# Patient Record
Sex: Male | Born: 1965 | Race: Black or African American | Hispanic: No | Smoking: Never smoker
Health system: Southern US, Community
[De-identification: ages and names within clinical notes are randomized; demographics above are authoritative.]

## PROBLEM LIST (undated history)

## (undated) DIAGNOSIS — T7840XA Allergy, unspecified, initial encounter: Secondary | ICD-10-CM

## (undated) DIAGNOSIS — K635 Polyp of colon: Secondary | ICD-10-CM

## (undated) DIAGNOSIS — N400 Enlarged prostate without lower urinary tract symptoms: Secondary | ICD-10-CM

## (undated) DIAGNOSIS — E785 Hyperlipidemia, unspecified: Secondary | ICD-10-CM

## (undated) DIAGNOSIS — B019 Varicella without complication: Secondary | ICD-10-CM

## (undated) HISTORY — DX: Hyperlipidemia, unspecified: E78.5

## (undated) HISTORY — DX: Benign prostatic hyperplasia without lower urinary tract symptoms: N40.0

## (undated) HISTORY — DX: Allergy, unspecified, initial encounter: T78.40XA

## (undated) HISTORY — DX: Varicella without complication: B01.9

## (undated) HISTORY — DX: Polyp of colon: K63.5

## (undated) HISTORY — PX: KNEE ARTHROSCOPY W/ ACL RECONSTRUCTION: SHX1858

---

## 2010-04-26 ENCOUNTER — Emergency Department (HOSPITAL_COMMUNITY)
Admission: EM | Admit: 2010-04-26 | Discharge: 2010-04-26 | Payer: Self-pay | Source: Home / Self Care | Admitting: Emergency Medicine

## 2010-07-07 LAB — CBC
MCH: 30.5 pg (ref 26.0–34.0)
MCHC: 33.9 g/dL (ref 30.0–36.0)
RDW: 13.2 % (ref 11.5–15.5)

## 2010-07-07 LAB — BASIC METABOLIC PANEL
BUN: 16 mg/dL (ref 6–23)
CO2: 25 mEq/L (ref 19–32)
Calcium: 9.6 mg/dL (ref 8.4–10.5)
GFR calc non Af Amer: >60 mL/min (ref >60)
Glucose, Bld: 99 mg/dL (ref 70–99)

## 2010-07-07 LAB — DIFFERENTIAL
Basophils Absolute: 0 10*3/uL (ref 0.0–0.1)
Basophils Relative: 0 % (ref 0–1)
Eosinophils Absolute: 0 10*3/uL (ref 0.0–0.7)
Monocytes Absolute: 0.4 10*3/uL (ref 0.1–1.0)
Monocytes Relative: 6 % (ref 3–12)
Neutro Abs: 4.5 10*3/uL (ref 1.7–7.7)
Neutrophils Relative %: 65 % (ref 43–77)

## 2010-07-07 LAB — POCT CARDIAC MARKERS
Myoglobin, poc: 165 ng/mL (ref 12–200)
Myoglobin, poc: 184 ng/mL (ref 12–200)
Troponin i, poc: <0.05 ng/mL (ref 0.00–0.09)

## 2010-07-07 LAB — URINALYSIS, ROUTINE W REFLEX MICROSCOPIC
Bilirubin Urine: NEGATIVE
Hgb urine dipstick: NEGATIVE
Nitrite: NEGATIVE
Protein, ur: NEGATIVE mg/dL
Urobilinogen, UA: 1 mg/dL (ref 0.0–1.0)

## 2011-07-08 IMAGING — CR DG CHEST 2V
2 series · 2 of 2 positions shown · non-contrast
Comparison: None.

CLINICAL DATA: Anxiety, arrhythmia

CHEST - 2 VIEW

[w chest pa]
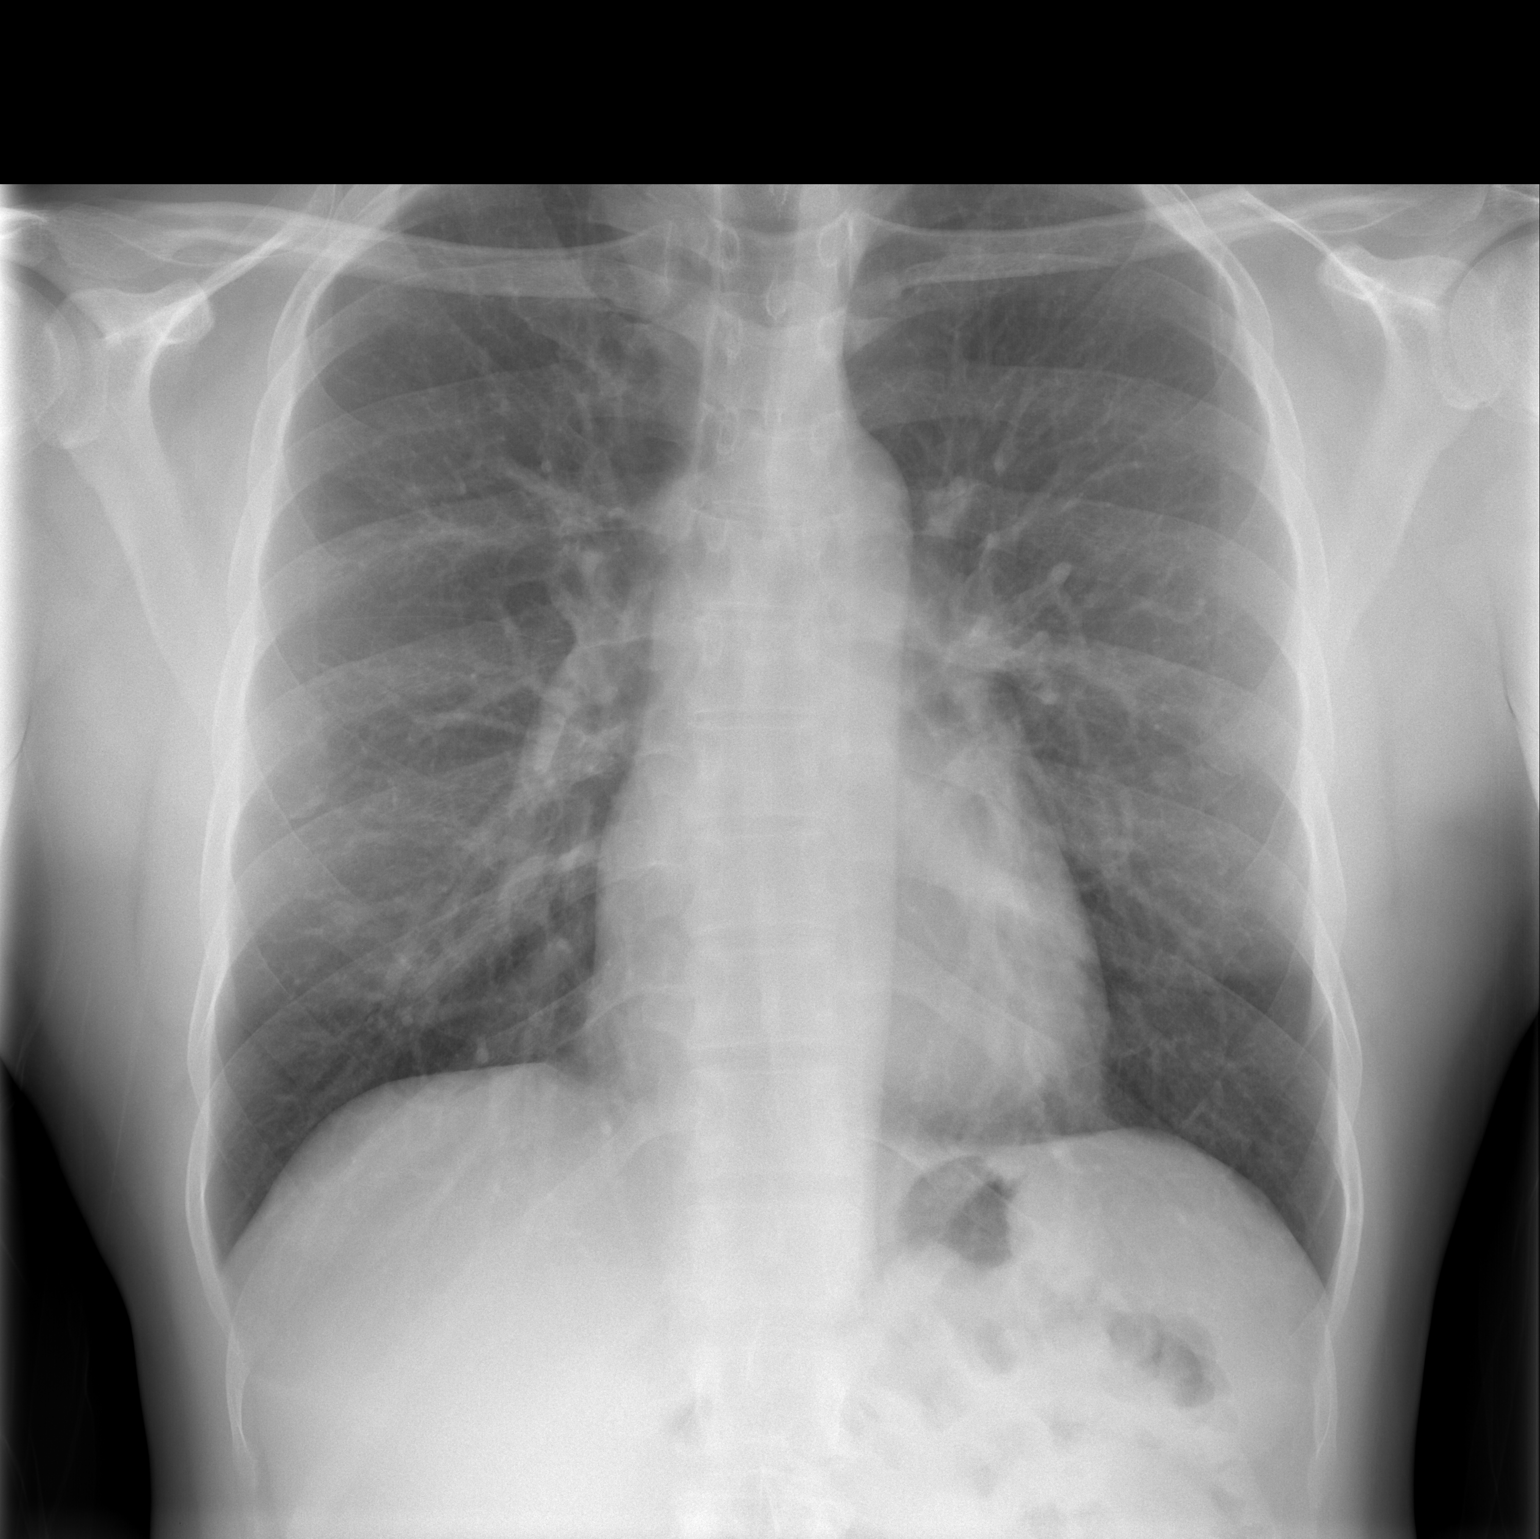

[w chest lat]
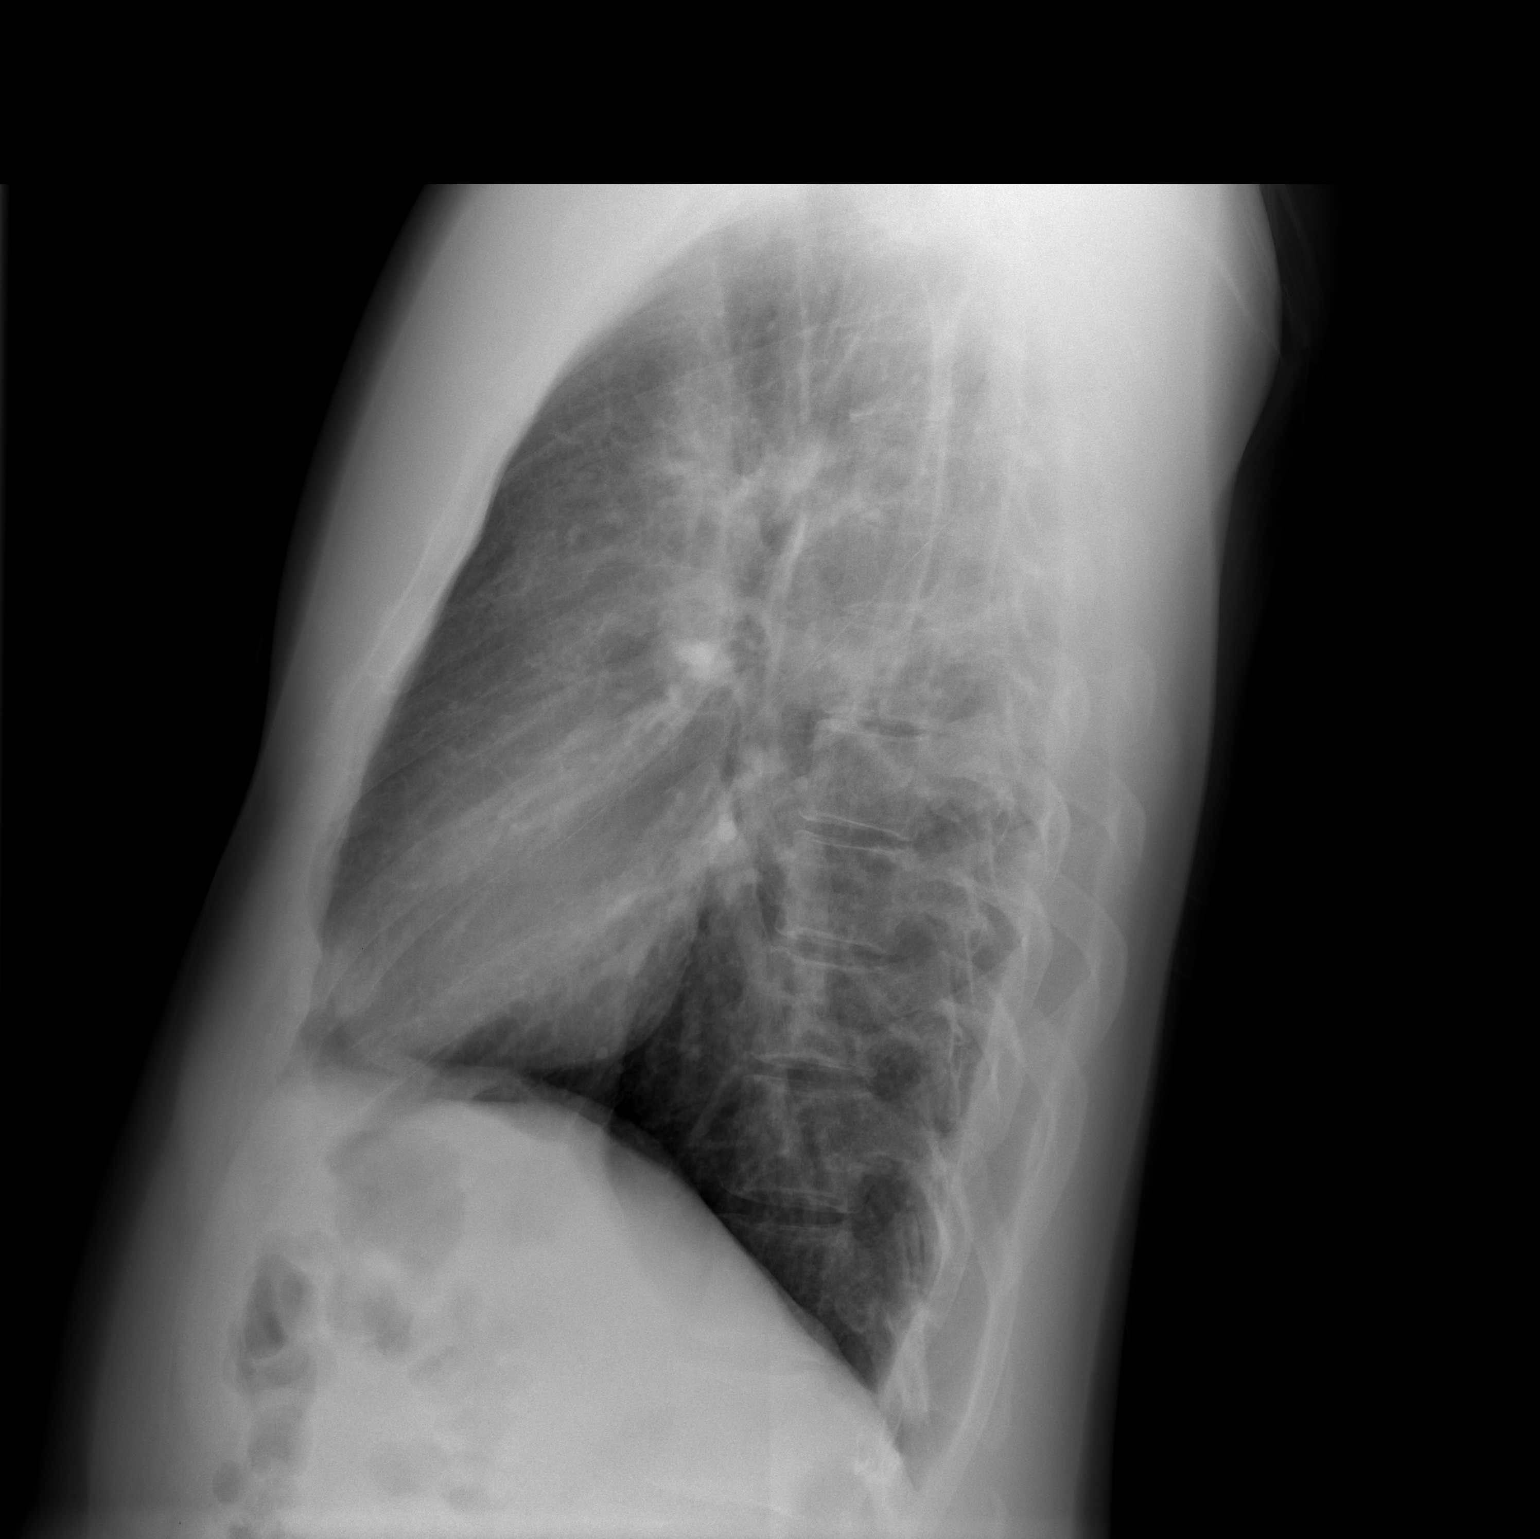

[2 of 2 positions shown; findings below may reference images not displayed]

FINDINGS: Normal mediastinum and heart silhouette.  Costophrenic
angles are clear.  No evidence effusion, infiltrate, or
pneumothorax.  There is chronic appearing bronchitic markings.
IMPRESSION: No acute cardiopulmonary process.

## 2014-06-06 DIAGNOSIS — N529 Male erectile dysfunction, unspecified: Secondary | ICD-10-CM | POA: Insufficient documentation

## 2014-06-06 HISTORY — DX: Male erectile dysfunction, unspecified: N52.9

## 2015-07-09 DIAGNOSIS — J3089 Other allergic rhinitis: Secondary | ICD-10-CM | POA: Insufficient documentation

## 2015-07-09 HISTORY — DX: Other allergic rhinitis: J30.89

## 2019-07-06 ENCOUNTER — Ambulatory Visit: Payer: Self-pay | Attending: Internal Medicine

## 2019-07-06 DIAGNOSIS — Z23 Encounter for immunization: Secondary | ICD-10-CM

## 2019-07-06 NOTE — Progress Notes (Signed)
   Covid-19 Vaccination Clinic  Name:  Deja Kaigler    MRN: [id number] DOB: 14-Jan-1966  07/06/2019  Mr. Wakeland was observed post Covid-19 immunization for 15 minutes without incident. He was provided with Vaccine Information Sheet and instruction to access the V-Safe system.   Mr. Dentler was instructed to call 911 with any severe reactions post vaccine: Marland Kitchen Difficulty breathing  . Swelling of face and throat  . A fast heartbeat  . A bad rash all over body  . Dizziness and weakness   Immunizations Administered    Name Date Dose VIS Date Route   Pfizer COVID-19 Vaccine 07/06/2019  8:57 AM 0.3 mL 04/07/2019 Intramuscular   Manufacturer: [employer], [employer]   Lot: [id number]   NDC: [address]-2

## 2019-07-31 ENCOUNTER — Ambulatory Visit: Payer: Self-pay | Attending: Internal Medicine

## 2019-07-31 DIAGNOSIS — Z23 Encounter for immunization: Secondary | ICD-10-CM

## 2019-07-31 NOTE — Progress Notes (Signed)
   Covid-19 Vaccination Clinic  Name:  Lucas Doyle    MRN: [id number] DOB: October 31, 1965  07/31/2019  [name] was observed post Covid-19 immunization for 15 minutes without incident. He was provided with Vaccine Information Sheet and instruction to access the V-Safe system.   [name] was instructed to call 911 with any severe reactions post vaccine: Marland Kitchen Difficulty breathing  . Swelling of face and throat  . A fast heartbeat  . A bad rash all over body  . Dizziness and weakness   Immunizations Administered    Name Date Dose VIS Date Route   Pfizer COVID-19 Vaccine 07/31/2019  8:56 AM 0.3 mL 04/07/2019 Intramuscular   Manufacturer: [employer], [employer]   Lot: [id number]   NDC: [address]-8

## 2019-10-24 ENCOUNTER — Other Ambulatory Visit: Payer: Self-pay

## 2019-10-24 ENCOUNTER — Ambulatory Visit (INDEPENDENT_AMBULATORY_CARE_PROVIDER_SITE_OTHER): Payer: 59 | Admitting: Family Medicine

## 2019-10-24 ENCOUNTER — Encounter: Payer: Self-pay | Admitting: Family Medicine

## 2019-10-24 VITALS — BP 138/79 | HR 88 | Temp 98.4°F | Resp 16 | Ht 70.75 in | Wt 188.4 lb

## 2019-10-24 DIAGNOSIS — Z131 Encounter for screening for diabetes mellitus: Secondary | ICD-10-CM | POA: Diagnosis not present

## 2019-10-24 DIAGNOSIS — Z114 Encounter for screening for human immunodeficiency virus [HIV]: Secondary | ICD-10-CM

## 2019-10-24 DIAGNOSIS — Z7689 Persons encountering health services in other specified circumstances: Secondary | ICD-10-CM

## 2019-10-24 DIAGNOSIS — Z125 Encounter for screening for malignant neoplasm of prostate: Secondary | ICD-10-CM | POA: Diagnosis not present

## 2019-10-24 DIAGNOSIS — Z130 Encounter for screening for diseases of the blood and blood-forming organs and certain disorders involving the immune mechanism: Secondary | ICD-10-CM

## 2019-10-24 DIAGNOSIS — N529 Male erectile dysfunction, unspecified: Secondary | ICD-10-CM | POA: Diagnosis not present

## 2019-10-24 DIAGNOSIS — E782 Mixed hyperlipidemia: Secondary | ICD-10-CM | POA: Diagnosis not present

## 2019-10-24 DIAGNOSIS — Z23 Encounter for immunization: Secondary | ICD-10-CM

## 2019-10-24 DIAGNOSIS — E663 Overweight: Secondary | ICD-10-CM | POA: Insufficient documentation

## 2019-10-24 DIAGNOSIS — Z0000 Encounter for general adult medical examination without abnormal findings: Secondary | ICD-10-CM | POA: Diagnosis not present

## 2019-10-24 DIAGNOSIS — R030 Elevated blood-pressure reading, without diagnosis of hypertension: Secondary | ICD-10-CM

## 2019-10-24 LAB — COMPREHENSIVE METABOLIC PANEL
ALT: 27 U/L (ref 0–53)
AST: 24 U/L (ref 0–37)
Albumin: 4.5 g/dL (ref 3.5–5.2)
Alkaline Phosphatase: 54 U/L (ref 39–117)
BUN: 11 mg/dL (ref 6–23)
CO2: 26 mEq/L (ref 19–32)
Calcium: 9 mg/dL (ref 8.4–10.5)
Chloride: 105 mEq/L (ref 96–112)
Creatinine, Ser: 0.85 mg/dL (ref 0.40–1.50)
GFR: 113.11 mL/min (ref >60.00)
Glucose, Bld: 103 mg/dL — ABNORMAL HIGH (ref 70–99)
Potassium: 3.7 mEq/L (ref 3.5–5.1)
Sodium: 139 mEq/L (ref 135–145)
Total Bilirubin: 0.6 mg/dL (ref 0.2–1.2)
Total Protein: 7.3 g/dL (ref 6.0–8.3)

## 2019-10-24 LAB — LIPID PANEL
Cholesterol: 225 mg/dL — ABNORMAL HIGH (ref 0–200)
HDL: 60.6 mg/dL (ref >39.00)
LDL Cholesterol: 145 mg/dL — ABNORMAL HIGH (ref 0–99)
NonHDL: 164.62
Total CHOL/HDL Ratio: 4
Triglycerides: 99 mg/dL (ref 0.0–149.0)
VLDL: 19.8 mg/dL (ref 0.0–40.0)

## 2019-10-24 LAB — CBC
HCT: 42.9 % (ref 39.0–52.0)
Hemoglobin: 14.3 g/dL (ref 13.0–17.0)
MCHC: 33.4 g/dL (ref 30.0–36.0)
MCV: 89.6 fl (ref 78.0–100.0)
Platelets: 189 10*3/uL (ref 150.0–400.0)
RBC: 4.78 Mil/uL (ref 4.22–5.81)
RDW: 13.6 % (ref 11.5–15.5)
WBC: 4 10*3/uL (ref 4.0–10.5)

## 2019-10-24 LAB — PSA: PSA: 0.81 ng/mL (ref 0.10–4.00)

## 2019-10-24 LAB — TSH: TSH: 0.81 u[IU]/mL (ref 0.35–4.50)

## 2019-10-24 LAB — HEMOGLOBIN A1C: Hgb A1c MFr Bld: 5.4 % (ref 4.6–6.5)

## 2019-10-24 MED ORDER — TADALAFIL 20 MG PO TABS: 20.0000 mg | ORAL_TABLET | Freq: Every day | ORAL | 5 refills | Status: DC | PRN

## 2019-10-24 NOTE — Patient Instructions (Addendum)
Shingrix #1 provided today.  Nurse visit in 3 months for shingrix #2.   Health Maintenance, Male Adopting a healthy lifestyle and getting preventive care are important in promoting health and wellness. Ask your health care provider about:  The right schedule for you to have regular tests and exams.  Things you can do on your own to prevent diseases and keep yourself healthy. What should I know about diet, weight, and exercise? Eat a healthy diet   Eat a diet that includes plenty of vegetables, fruits, low-fat dairy products, and lean protein.  Do not eat a lot of foods that are high in solid fats, added sugars, or sodium. Maintain a healthy weight Body mass index (BMI) is a measurement that can be used to identify possible weight problems. It estimates body fat based on height and weight. Your health care provider can help determine your BMI and help you achieve or maintain a healthy weight. Get regular exercise Get regular exercise. This is one of the most important things you can do for your health. Most adults should:  Exercise for at least 150 minutes each week. The exercise should increase your heart rate and make you sweat (moderate-intensity exercise).  Do strengthening exercises at least twice a week. This is in addition to the moderate-intensity exercise.  Spend less time sitting. Even light physical activity can be beneficial. Watch cholesterol and blood lipids Have your blood tested for lipids and cholesterol at 55 years of age, then have this test every 5 years. You may need to have your cholesterol levels checked more often if:  Your lipid or cholesterol levels are high.  You are older than 55 years of age.  You are at high risk for heart disease. What should I know about cancer screening? Many types of cancers can be detected early and may often be prevented. Depending on your health history and family history, you may need to have cancer screening at various ages.  This may include screening for:  Colorectal cancer.  Prostate cancer.  Skin cancer.  Lung cancer. What should I know about heart disease, diabetes, and high blood pressure? Blood pressure and heart disease  High blood pressure causes heart disease and increases the risk of stroke. This is more likely to develop in people who have high blood pressure readings, are of African descent, or are overweight.  Talk with your health care provider about your target blood pressure readings.  Have your blood pressure checked: ? Every 3-5 years if you are 53-74 years of age. ? Every year if you are 19 years old or older.  If you are between the ages of 5 and 53 and are a current or former smoker, ask your health care provider if you should have a one-time screening for abdominal aortic aneurysm (AAA). Diabetes Have regular diabetes screenings. This checks your fasting blood sugar level. Have the screening done:  Once every three years after age 44 if you are at a normal weight and have a low risk for diabetes.  More often and at a younger age if you are overweight or have a high risk for diabetes. What should I know about preventing infection? Hepatitis B If you have a higher risk for hepatitis B, you should be screened for this virus. Talk with your health care provider to find out if you are at risk for hepatitis B infection. Hepatitis C Blood testing is recommended for:  Everyone born from [birth year] through 1965.  Anyone with known risk  factors for hepatitis C. Sexually transmitted infections (STIs)  You should be screened each year for STIs, including gonorrhea and chlamydia, if: ? You are sexually active and are younger than 55 years of age. ? You are older than 55 years of age and your health care provider tells you that you are at risk for this type of infection. ? Your sexual activity has changed since you were last screened, and you are at increased risk for chlamydia or gonorrhea.  Ask your health care provider if you are at risk.  Ask your health care provider about whether you are at high risk for HIV. Your health care provider may recommend a prescription medicine to help prevent HIV infection. If you choose to take medicine to prevent HIV, you should first get tested for HIV. You should then be tested every 3 months for as long as you are taking the medicine. Follow these instructions at home: Lifestyle  Do not use any products that contain nicotine or tobacco, such as cigarettes, e-cigarettes, and chewing tobacco. If you need help quitting, ask your health care provider.  Do not use street drugs.  Do not share needles.  Ask your health care provider for help if you need support or information about quitting drugs. Alcohol use  Do not drink alcohol if your health care provider tells you not to drink.  If you drink alcohol: ? Limit how much you have to 0-2 drinks a day. ? Be aware of how much alcohol is in your drink. In the [place] one drink equals one 12 oz bottle of beer (355 mL), one 5 oz glass of wine (148 mL), or one 1 oz glass of hard liquor (44 mL). General instructions  Schedule regular health, dental, and eye exams.  Stay current with your vaccines.  Tell your health care provider if: ? You often feel depressed. ? You have ever been abused or do not feel safe at home. Summary  Adopting a healthy lifestyle and getting preventive care are important in promoting health and wellness.  Follow your health care provider's instructions about healthy diet, exercising, and getting tested or screened for diseases.  Follow your health care provider's instructions on monitoring your cholesterol and blood pressure. This information is not intended to replace advice given to you by your health care provider. Make sure you discuss any questions you have with your health care provider. Document Revised: 04/06/2018 Document Reviewed: 04/06/2018 Elsevier Patient  Education  2020 [employer].

## 2019-10-24 NOTE — Progress Notes (Signed)
Patient ID: Lucas Doyle, male  DOB: [birth year], 54 y.o.   MRN: [id number] Patient Care Team    Relationship Specialty Notifications Start End  Natalia Leatherwood, DO PCP - General Family Medicine  10/24/19   Dorna Leitz, MD Referring Physician Internal Medicine  10/25/19     Chief Complaint  Patient presents with   Establish Care    Would like CPE. Dr Landry Dyke.  Colonoscopy in 2018- to repeat in 10 yrs    Annual Exam    Subjective: Lucas Doyle is a 54 y.o.  male present for new patient establishment. All past medical history, surgical history, allergies, family history, immunizations, medications and social history were updated in the electronic medical record today. All recent labs, ED visits and hospitalizations within the last year were reviewed.   Health maintenance:  Colonoscopy: completed 12/02/2015, by Dr. Octaviano Glow follow up 10 years Immunizations: tdap 2012, Influenza UTD (encouraged yearly), shingrix #1 completed today.  Covid series completed. Infectious disease screening: HIV completed today.  PSA:  Lab Results  Component Value Date   PSA 0.81 10/24/2019  -no prior records available.  Patient reports always has been normal.  Family history of prostate cancer in his father.  African-American male-higher risk. Assistive device: None Oxygen use: None Patient has a Dental home. Hospitalizations/ED visits: No prior records available  Patient also would like to discuss his erectile dysfunction today.  He reports he had been on Cialis 20 mg as needed for his erectile dysfunction.  He denies any side effects to medication use.  He would like refills to this medication.  Depression screen [facility] 2/9 10/24/2019  Decreased Interest 0  Down, Depressed, Hopeless 0  PHQ - 2 Score 0   No flowsheet data found.      No flowsheet data found.   Immunization History  Administered Date(s) Administered   Influenza-Unspecified 02/23/2014, 02/23/2014, 02/08/2015,  02/08/2015, 02/16/2016, 02/16/2016   PFIZER SARS-COV-2 Vaccination 07/06/2019, 07/31/2019   Tdap 07/09/2010, 07/09/2010   Zoster Recombinat (Shingrix) 10/24/2019    No exam data present  Past Medical History:  Diagnosis Date   Allergy    Chicken pox    Colon polyps    Hyperlipidemia    Male erectile disorder 06/06/2014   Other allergic rhinitis 07/09/2015   No Known Allergies Past Surgical History:  Procedure Laterality Date   KNEE ARTHROSCOPY W/ ACL RECONSTRUCTION     Family History  Problem Relation Age of Onset   Early death Mother    Diabetes Mother    Heart disease Mother    Heart attack Mother    Stroke Mother    Alcohol abuse Father    Cancer Father    Prostate cancer Father    Hypertension Sister    Seizures Son    Throat cancer Maternal Grandfather    Prostate cancer Paternal Grandfather    Social History   Social History Narrative   Not on file    Allergies as of 10/24/2019   No Known Allergies     Medication List       Accurate as of October 24, 2019 11:59 PM. If you have any questions, ask your nurse or doctor.        OVER THE COUNTER MEDICATION Symplex M  135mg  Once daily   tadalafil 20 MG tablet Commonly known as: Cialis Take 1 tablet (20 mg total) by mouth daily as needed for erectile dysfunction. Started by: , DO  All past medical history, surgical history, allergies, family history, immunizations andmedications were updated in the EMR today and reviewed under the history and medication portions of their EMR.     No results found.   ROS: 14 pt review of systems performed and negative (unless mentioned in an HPI)  Objective: BP 138/79 (BP Location: Left Arm, Patient Position: Sitting, Cuff Size: Normal)    Pulse 88    Temp 98.4 F (36.9 C) (Temporal)    Resp 16    Ht 5' 10.75" (1.797 m)    Wt 188 lb 6 oz (85.4 kg)    SpO2 97%    BMI 26.46 kg/m  Gen: Afebrile. No acute distress. Nontoxic in  appearance, well-developed, well-nourished, physically fit, African-American male, very pleasant HENT: AT. [place]. Bilateral TM visualized and normal in appearance, normal external auditory canal. MMM, no oral lesions, adequate dentition. Bilateral nares within normal limits. Throat without erythema, ulcerations or exudates.  No cough on exam, no hoarseness on exam. Eyes:Pupils Equal Round Reactive to light, Extraocular movements intact,  Conjunctiva without redness, discharge or icterus. Neck/lymp/endocrine: Supple, no lymphadenopathy, no thyromegaly CV: RRR no murmur, no edema, +2/4 P posterior tibialis pulses.  Chest: CTAB, no wheeze, rhonchi or crackles.  Normal respiratory effort.  Good air movement. Abd: Soft.  Flat. NTND. BS present.  No masses palpated. No hepatosplenomegaly. No rebound tenderness or guarding. Skin: No rashes, purpura or petechiae. Warm and well-perfused. Skin intact. Neuro/Msk:  Normal gait. PERLA. EOMi. Alert. Oriented x3.  Cranial nerves II through XII intact. Muscle strength 5/5 upper/lower extremity. DTRs equal bilaterally. Psych: Normal affect, dress and demeanor. Normal speech. Normal thought content and judgment.   Assessment/plan: Lucas Doyle is a 54 y.o. male present for est/cpe Diabetes mellitus screening - Hemoglobin A1c Elevated BP without diagnosis of hypertension Patient to monitor outside of doctor's office setting.  He reports blood pressures always have been well controlled even at recent dental visits. - Comprehensive metabolic panel - TSH -CBC  prostate cancer screening High risk AAM with family history in first-degree relative (father) with prostate cancer Encouraged yearly PSAs - PSA Encounter for screening for HIV - HIV antibody (with reflex) Need for shingles vaccine Nurse visit 3 months for Shingrix No. 2 - Varicella-zoster vaccine IM  Male erectile disorder Patient reports prior use of Cialis 20 mg daily PR and for erectile disorder.   He denies any side effects with that medication and would like refills on that prescription today.  Mixed hyperlipidemia Lipids collected today Patient reports a history of hyperlipidemia that he controls with diet alone.  Strong family history for heart disease and stroke. Continue dietary changes and routine exercise.  Encounter for preventive health examination Patient was encouraged to exercise greater than 150 minutes a week. Patient was encouraged to choose a diet filled with fresh fruits and vegetables, and lean meats. AVS provided to patient today for education/recommendation on gender specific health and safety maintenance. Colonoscopy: Due 11/2025 Immunizations: tdap 2012, Influenza UTD (encouraged yearly), shingrix #1 completed today.  Covid series completed. Infectious disease screening: HIV completed today.  PSA: Collected today  Patient reports always has been normal.  Family history of prostate cancer in his father.  African-American male-higher risk.  Return in about 1 year (around 10/23/2020) for CPE (30 min).  Orders Placed This Encounter  Procedures   Varicella-zoster vaccine IM   CBC   Comprehensive metabolic panel   Hemoglobin A1c   Lipid panel   TSH   PSA  HIV antibody (with reflex)   Meds ordered this encounter  Medications   tadalafil (CIALIS) 20 MG tablet    Sig: Take 1 tablet (20 mg total) by mouth daily as needed for erectile dysfunction.    Dispense:  10 tablet    Refill:  5   Referral Orders  No referral(s) requested today     Note is dictated utilizing voice recognition software. Although note has been proof read prior to signing, occasional typographical errors still can be missed. If any questions arise, please do not hesitate to call for verification.  Electronically signed by: Felix Pacini, DO [facility] [place]

## 2019-10-25 ENCOUNTER — Encounter: Payer: Self-pay | Admitting: Family Medicine

## 2019-10-25 ENCOUNTER — Telehealth: Payer: Self-pay | Admitting: Family Medicine

## 2019-10-25 DIAGNOSIS — E785 Hyperlipidemia, unspecified: Secondary | ICD-10-CM | POA: Insufficient documentation

## 2019-10-25 DIAGNOSIS — E782 Mixed hyperlipidemia: Secondary | ICD-10-CM

## 2019-10-25 LAB — HIV ANTIBODY (ROUTINE TESTING W REFLEX): HIV 1&2 Ab, 4th Generation: NONREACTIVE

## 2019-10-25 NOTE — Telephone Encounter (Signed)
Please inform patient Liver, kidney and thyroid function are normal Blood cell counts and electrolytes are normal Prostate cancer screening levels are normal HIV screening is negative Diabetes screening/A1c is normal at 5.4 Cholesterol panel looks okay. With a total cholesterol of 225, HDL 60, LDL 145 and triglycerides 99.   -Cholesterol panel is okay.  However it would be to his benefit to try to get his LDL less than 130 since he has a significant family history of heart disease and stroke.  Continue the exercise regimen, he does well on this. I would recommend a mediterranean diet if he is not following one already.  A mediterranean diet is high in fruits, vegetables, whole grains, fish, chicken, nuts, healthy fats (olive oil or canola oil). Low fat dairy.  Limit butter, margarine, red meat and sweets.   We will continue to monitor levels yearly and make recommendations for him at his physicals. Follow-up 1 year for CPE, sooner if needed or needing refills on medications prior to physical

## 2019-10-25 NOTE — Telephone Encounter (Signed)
Pt was called and given lab results, he verbalized understanding  

## 2019-10-26 ENCOUNTER — Encounter: Payer: Self-pay | Admitting: Family Medicine

## 2019-10-26 DIAGNOSIS — N528 Other male erectile dysfunction: Secondary | ICD-10-CM

## 2019-10-26 NOTE — Telephone Encounter (Signed)
Pt wrote another message saying there was another option you had given him. Please advise

## 2019-10-26 NOTE — Telephone Encounter (Signed)
Please place urology for referral for erectile dysfunction.

## 2019-10-26 NOTE — Telephone Encounter (Signed)
Options are: -He can try Viagra (or generic Viagra) Or -Place a referral to urology as they sometimes have better options for discounts on these type of medications.  Let me know what he decides. Thanks

## 2019-10-26 NOTE — Telephone Encounter (Signed)
Pt would like urologist referral

## 2019-10-27 NOTE — Addendum Note (Signed)
Addended by: Daryel November L on: 10/27/2019 08:17 AM   Modules accepted: Orders

## 2019-12-25 ENCOUNTER — Ambulatory Visit (INDEPENDENT_AMBULATORY_CARE_PROVIDER_SITE_OTHER): Payer: 59

## 2019-12-25 ENCOUNTER — Other Ambulatory Visit: Payer: Self-pay

## 2019-12-25 DIAGNOSIS — Z23 Encounter for immunization: Secondary | ICD-10-CM | POA: Diagnosis not present

## 2019-12-25 NOTE — Progress Notes (Signed)
Lucas Doyle is a 54 y.o. male presents to the office today for Shingrix injections, per physician's orders. Original order: 10/24/2019 Nurse visit 3 months for Shingrix No. 2 - Varicella-zoster vaccine IM was administered in left deltoid today. Patient tolerated injection. Patient due for follow up labs/provider appt: No. Date due: n/a, appt made No Patient next injection due: n/a, appt made No  Paschal Dopp

## 2020-10-23 ENCOUNTER — Encounter: Payer: 59 | Admitting: Family Medicine

## 2020-11-18 ENCOUNTER — Other Ambulatory Visit: Payer: Self-pay

## 2020-11-18 ENCOUNTER — Encounter: Payer: Self-pay | Admitting: Family Medicine

## 2020-11-18 ENCOUNTER — Ambulatory Visit (INDEPENDENT_AMBULATORY_CARE_PROVIDER_SITE_OTHER): Payer: 59 | Admitting: Family Medicine

## 2020-11-18 VITALS — BP 114/72 | HR 68 | Temp 98.0°F | Ht 71.5 in | Wt 180.0 lb

## 2020-11-18 DIAGNOSIS — Z0000 Encounter for general adult medical examination without abnormal findings: Secondary | ICD-10-CM | POA: Diagnosis not present

## 2020-11-18 DIAGNOSIS — Z1159 Encounter for screening for other viral diseases: Secondary | ICD-10-CM

## 2020-11-18 DIAGNOSIS — Z23 Encounter for immunization: Secondary | ICD-10-CM | POA: Diagnosis not present

## 2020-11-18 DIAGNOSIS — Z131 Encounter for screening for diabetes mellitus: Secondary | ICD-10-CM | POA: Diagnosis not present

## 2020-11-18 DIAGNOSIS — B351 Tinea unguium: Secondary | ICD-10-CM

## 2020-11-18 DIAGNOSIS — Z125 Encounter for screening for malignant neoplasm of prostate: Secondary | ICD-10-CM | POA: Diagnosis not present

## 2020-11-18 DIAGNOSIS — E782 Mixed hyperlipidemia: Secondary | ICD-10-CM | POA: Diagnosis not present

## 2020-11-18 DIAGNOSIS — Z79899 Other long term (current) drug therapy: Secondary | ICD-10-CM

## 2020-11-18 LAB — CBC WITH DIFFERENTIAL/PLATELET
Basophils Absolute: 0 10*3/uL (ref 0.0–0.1)
Basophils Relative: 0.4 % (ref 0.0–3.0)
Eosinophils Absolute: 0 10*3/uL (ref 0.0–0.7)
Eosinophils Relative: 0.9 % (ref 0.0–5.0)
HCT: 44 % (ref 39.0–52.0)
Hemoglobin: 14.6 g/dL (ref 13.0–17.0)
Lymphocytes Relative: 39 % (ref 12.0–46.0)
Lymphs Abs: 1.5 10*3/uL (ref 0.7–4.0)
MCHC: 33.3 g/dL (ref 30.0–36.0)
MCV: 89.3 fl (ref 78.0–100.0)
Monocytes Absolute: 0.4 10*3/uL (ref 0.1–1.0)
Monocytes Relative: 11.2 % (ref 3.0–12.0)
Neutro Abs: 1.9 10*3/uL (ref 1.4–7.7)
Neutrophils Relative %: 48.5 % (ref 43.0–77.0)
Platelets: 185 10*3/uL (ref 150.0–400.0)
RBC: 4.92 Mil/uL (ref 4.22–5.81)
RDW: 13 % (ref 11.5–15.5)
WBC: 3.9 10*3/uL — ABNORMAL LOW (ref 4.0–10.5)

## 2020-11-18 LAB — COMPREHENSIVE METABOLIC PANEL
ALT: 18 U/L (ref 0–53)
AST: 19 U/L (ref 0–37)
Albumin: 4.3 g/dL (ref 3.5–5.2)
Alkaline Phosphatase: 53 U/L (ref 39–117)
BUN: 14 mg/dL (ref 6–23)
CO2: 25 mEq/L (ref 19–32)
Calcium: 9.2 mg/dL (ref 8.4–10.5)
Chloride: 104 mEq/L (ref 96–112)
Creatinine, Ser: 0.86 mg/dL (ref 0.40–1.50)
GFR: 96.88 mL/min (ref >60.00)
Glucose, Bld: 96 mg/dL (ref 70–99)
Potassium: 3.9 mEq/L (ref 3.5–5.1)
Sodium: 139 mEq/L (ref 135–145)
Total Bilirubin: 0.6 mg/dL (ref 0.2–1.2)
Total Protein: 7.1 g/dL (ref 6.0–8.3)

## 2020-11-18 LAB — LIPID PANEL
Cholesterol: 233 mg/dL — ABNORMAL HIGH (ref 0–200)
HDL: 50.4 mg/dL (ref >39.00)
LDL Cholesterol: 159 mg/dL — ABNORMAL HIGH (ref 0–99)
NonHDL: 182.5
Total CHOL/HDL Ratio: 5
Triglycerides: 119 mg/dL (ref 0.0–149.0)
VLDL: 23.8 mg/dL (ref 0.0–40.0)

## 2020-11-18 LAB — TSH: TSH: 0.98 u[IU]/mL (ref 0.35–5.50)

## 2020-11-18 LAB — PSA: PSA: 0.93 ng/mL (ref 0.10–4.00)

## 2020-11-18 LAB — HEMOGLOBIN A1C: Hgb A1c MFr Bld: 5.5 % (ref 4.6–6.5)

## 2020-11-18 MED ORDER — TERBINAFINE HCL 250 MG PO TABS
250.0000 mg | ORAL_TABLET | Freq: Every day | ORAL | 1 refills | Status: DC
Start: 2020-11-18 — End: 2020-12-16

## 2020-11-18 NOTE — Patient Instructions (Signed)
Great to see you today.  Your Blood pressure looks great today.   If labs were collected, we will inform you of lab results once received either by echart message or telephone call.   - echart message- for normal results that have been seen by the patient already.   - telephone call: abnormal results or if patient has not viewed results in their echart.   Health Maintenance, Male Adopting a healthy lifestyle and getting preventive care are important in promoting health and wellness. Ask your health care provider about: The right schedule for you to have regular tests and exams. Things you can do on your own to prevent diseases and keep yourself healthy. What should I know about diet, weight, and exercise? Eat a healthy diet  Eat a diet that includes plenty of vegetables, fruits, low-fat dairy products, and lean protein. Do not eat a lot of foods that are high in solid fats, added sugars, or sodium.  Maintain a healthy weight Body mass index (BMI) is a measurement that can be used to identify possible weight problems. It estimates body fat based on height and weight. Your health care provider can help determine your BMI and help you achieve or maintain ahealthy weight. Get regular exercise Get regular exercise. This is one of the most important things you can do for your health. Most adults should: Exercise for at least 150 minutes each week. The exercise should increase your heart rate and make you sweat (moderate-intensity exercise). Do strengthening exercises at least twice a week. This is in addition to the moderate-intensity exercise. Spend less time sitting. Even light physical activity can be beneficial. Watch cholesterol and blood lipids Have your blood tested for lipids and cholesterol at 56 years of age, then havethis test every 5 years. You may need to have your cholesterol levels checked more often if: Your lipid or cholesterol levels are high. You are older than 56 years of  age. You are at high risk for heart disease. What should I know about cancer screening? Many types of cancers can be detected early and may often be prevented. Depending on your health history and family history, you may need to have cancer screening at various ages. This may include screening for: Colorectal cancer. Prostate cancer. Skin cancer. Lung cancer. What should I know about heart disease, diabetes, and high blood pressure? Blood pressure and heart disease High blood pressure causes heart disease and increases the risk of stroke. This is more likely to develop in people who have high blood pressure readings, are of African descent, or are overweight. Talk with your health care provider about your target blood pressure readings. Have your blood pressure checked: Every 3-5 years if you are 59-44 years of age. Every year if you are 71 years old or older. If you are between the ages of 39 and 68 and are a current or former smoker, ask your health care provider if you should have a one-time screening for abdominal aortic aneurysm (AAA). Diabetes Have regular diabetes screenings. This checks your fasting blood sugar level. Have the screening done: Once every three years after age 7 if you are at a normal weight and have a low risk for diabetes. More often and at a younger age if you are overweight or have a high risk for diabetes. What should I know about preventing infection? Hepatitis B If you have a higher risk for hepatitis B, you should be screened for this virus. Talk with your health care provider  to find out if you are at risk forhepatitis B infection. Hepatitis C Blood testing is recommended for: Everyone born from [birth year] through 1965. Anyone with known risk factors for hepatitis C. Sexually transmitted infections (STIs) You should be screened each year for STIs, including gonorrhea and chlamydia, if: You are sexually active and are younger than 56 years of age. You are older  than 56 years of age and your health care provider tells you that you are at risk for this type of infection. Your sexual activity has changed since you were last screened, and you are at increased risk for chlamydia or gonorrhea. Ask your health care provider if you are at risk. Ask your health care provider about whether you are at high risk for HIV. Your health care provider may recommend a prescription medicine to help prevent HIV infection. If you choose to take medicine to prevent HIV, you should first get tested for HIV. You should then be tested every 3 months for as long as you are taking the medicine. Follow these instructions at home: Lifestyle Do not use any products that contain nicotine or tobacco, such as cigarettes, e-cigarettes, and chewing tobacco. If you need help quitting, ask your health care provider. Do not use street drugs. Do not share needles. Ask your health care provider for help if you need support or information about quitting drugs. Alcohol use Do not drink alcohol if your health care provider tells you not to drink. If you drink alcohol: Limit how much you have to 0-2 drinks a day. Be aware of how much alcohol is in your drink. In the [place] one drink equals one 12 oz bottle of beer (355 mL), one 5 oz glass of wine (148 mL), or one 1 oz glass of hard liquor (44 mL). General instructions Schedule regular health, dental, and eye exams. Stay current with your vaccines. Tell your health care provider if: You often feel depressed. You have ever been abused or do not feel safe at home. Summary Adopting a healthy lifestyle and getting preventive care are important in promoting health and wellness. Follow your health care provider's instructions about healthy diet, exercising, and getting tested or screened for diseases. Follow your health care provider's instructions on monitoring your cholesterol and blood pressure. This information is not intended to replace advice  given to you by your health care provider. Make sure you discuss any questions you have with your healthcare provider. Document Revised: 04/06/2018 Document Reviewed: 04/06/2018 Elsevier Patient Education  2022 [employer].

## 2020-11-18 NOTE — Progress Notes (Signed)
This visit occurred during the SARS-CoV-2 public health emergency.  Safety protocols were in place, including screening questions prior to the visit, additional usage of staff PPE, and extensive cleaning of exam room while observing appropriate contact time as indicated for disinfecting solutions.    Patient ID: Lucas Doyle, male  DOB: [birth year], 55 y.o.   MRN: [id number] Patient Care Team    Relationship Specialty Notifications Start End  Ma Hillock, DO PCP - General Family Medicine  10/24/19   Lucas Bayley, MD Referring Physician Internal Medicine  10/25/19   Pa, [facility]    11/18/20     Chief Complaint  Patient presents with   Annual Exam    Pt is fasting    Subjective: Lucas Doyle is a 55 y.o. male present for CPE. All past medical history, surgical history, allergies, family history, immunizations, medications and social history were updated in the electronic medical record today. All recent labs, ED visits and hospitalizations within the last year were reviewed.  Health maintenance:  Colonoscopy: completed 12/02/2015, by Dr. Rolm Bookbinder follow up 10 years Immunizations: tdap updated today, Influenza UTD (encouraged yearly), shingrix series completed.  Covid x3- counseled on booster Infectious disease screening: HIV completed today.  PSA: Family history of prostate cancer in his father.  African-American male-higher risk. Lab Results  Component Value Date   PSA 0.81 10/24/2019  , pt was counseled on prostate cancer screenings.  Assistive device: none Oxygen AYO:KHTX Patient has a Dental home. Hospitalizations/ED visits: reviewed  Toenail fungus: Pt would like a medication for his toenail fungus. He states he has had it for years. He has tried topical OTC treatments without great outcomes.   Depression screen [facility] 2/9 11/18/2020 10/24/2019  Decreased Interest 0 0  Down, Depressed, Hopeless 0 0  PHQ - 2 Score 0 0   No flowsheet data found.       No flowsheet data found.  Immunization History  Administered Date(s) Administered   Influenza-Unspecified 02/23/2014, 02/23/2014, 02/08/2015, 02/08/2015, 02/16/2016, 02/16/2016   PFIZER(Purple Top)SARS-COV-2 Vaccination 07/06/2019, 07/31/2019, 04/02/2020   Tdap 07/09/2010, 07/09/2010, 11/18/2020   Zoster Recombinat (Shingrix) 10/24/2019, 12/25/2019   Past Medical History:  Diagnosis Date   Allergy    BPH (benign prostatic hyperplasia)    Chicken pox    Colon polyps    Hyperlipidemia    Male erectile disorder 06/06/2014   Other allergic rhinitis 07/09/2015   No Known Allergies Past Surgical History:  Procedure Laterality Date   KNEE ARTHROSCOPY W/ ACL RECONSTRUCTION     Family History  Problem Relation Age of Onset   Early death Mother    Diabetes Mother    Heart disease Mother    Heart attack Mother    Stroke Mother    Alcohol abuse Father    Prostate cancer Father    Hypertension Sister    Seizures Son    Throat cancer Maternal Grandfather    Prostate cancer Paternal Grandfather    Social History   Social History Narrative   Marital status/children/pets: Divorced.    Education/employment: 4 yrs college. Self employed.    Safety:      -smoke alarm in the home:Yes     - wears seatbelt: Yes     - Feels safe in their relationships: Yes    Allergies as of 11/18/2020   No Known Allergies      Medication List        Accurate as of November 18, 2020 10:41 AM. If you have  any questions, ask your nurse or doctor.          STOP taking these medications    tadalafil 20 MG tablet Commonly known as: Cialis Stopped by: Howard Pouch, DO       TAKE these medications    OVER THE COUNTER MEDICATION Symplex M  162m Once daily   terbinafine 250 MG tablet Commonly known as: LAMISIL Take 1 tablet (250 mg total) by mouth daily. Started by: RHoward Pouch DO       All past medical history, surgical history, allergies, family history, immunizations  andmedications were updated in the EMR today and reviewed under the history and medication portions of their EMR.     No results found for this or any previous visit (from the past 2160 hour(s)).  No results found.   ROS: 14 pt review of systems performed and negative (unless mentioned in an HPI)  Objective: BP 114/72   Pulse 68   Temp 98 F (36.7 C) (Oral)   Ht 5' 11.5" (1.816 m)   Wt 180 lb (81.6 kg)   SpO2 98%   BMI 24.76 kg/m  Gen: Afebrile. No acute distress. Nontoxic in appearance, well-developed, well-nourished,  very pleasant male.  HENT: AT. [place]. Bilateral TM visualized and normal in appearance, normal external auditory canal. MMM, no oral lesions, adequate dentition. Bilateral nares within normal limits. Throat without erythema, ulcerations or exudates. no Cough on exam, no hoarseness on exam. Eyes:Pupils Equal Round Reactive to light, Extraocular movements intact,  Conjunctiva without redness, discharge or icterus. Neck/lymp/endocrine: Supple,no lymphadenopathy, no thyromegaly CV: RRR no murmur, no edema, +2/4 P posterior tibialis pulses.  Chest: CTAB, no wheeze, rhonchi or crackles. normal Respiratory effort. good Air movement. Abd: Soft. flat. NTND. BS present. no Masses palpated. No hepatosplenomegaly. No rebound tenderness or guarding. Skin: no rashes, purpura or petechiae. Warm and well-perfused. Skin intact. Neuro/Msk:  Normal gait. PERLA. EOMi. Alert. Oriented x3.  Cranial nerves II through XII intact. Muscle strength 5/5 upper/lower extremity. DTRs equal bilaterally. Psych: Normal affect, dress and demeanor. Normal speech. Normal thought content and judgment.  No results found.  Assessment/plan: GLivan Hiresis a 55y.o. male present for CPE Mixed hyperlipidemia - CBC with Differential/Platelet - Comprehensive metabolic panel - Lipid panel - TSH Need for Tdap vaccination - Tdap vaccine greater than or equal to 7yo IM Prostate cancer screening -  PSA Diabetes mellitus screening - Hemoglobin A1c Need for hepatitis C screening test - Hepatitis C Antibody  Onychomycosis of toenail Discussed oral terb QD for 12 weeks> ordered for him today.  CMP in 4 weeks and 8 weeks.   Routine general medical examination at a health care facility Colonoscopy: completed 12/02/2015, by Dr. BRolm Bookbinderfollow up 10 years Immunizations: tdap updated today, Influenza UTD (encouraged yearly), shingrix series completed.  Covid x3- counseled on booster Infectious disease screening: HIV completed today.  PSA: Family history of prostate cancer in his father.  African-American male-higher risk. Patient was encouraged to exercise greater than 150 minutes a week. Patient was encouraged to choose a diet filled with fresh fruits and vegetables, and lean meats. AVS provided to patient today for education/recommendation on gender specific health and safety maintenance.  Return in about 1 year (around 11/18/2021) for CPE (30 min) and lab appt only in 4 weeks. .   Orders Placed This Encounter  Procedures   Tdap vaccine greater than or equal to 7yo IM   CBC with Differential/Platelet   Comprehensive metabolic panel   Hemoglobin  A1c   Lipid panel   PSA   TSH   Hepatitis C Antibody   Comp Met (CMET)   Comp Met (CMET)   Meds ordered this encounter  Medications   terbinafine (LAMISIL) 250 MG tablet    Sig: Take 1 tablet (250 mg total) by mouth daily.    Dispense:  30 tablet    Refill:  1    Referral Orders  No referral(s) requested today     Note is dictated utilizing voice recognition software. Although note has been proof read prior to signing, occasional typographical errors still can be missed. If any questions arise, please do not hesitate to call for verification.  Electronically signed by: Howard Pouch, DO [place]

## 2020-11-19 ENCOUNTER — Telehealth: Payer: Self-pay | Admitting: Family Medicine

## 2020-11-19 LAB — HEPATITIS C ANTIBODY
Hepatitis C Ab: NONREACTIVE
SIGNAL TO CUT-OFF: 0.01 (ref <1.00)

## 2020-11-19 NOTE — Telephone Encounter (Signed)
Please call patient Liver, kidney and thyroid function are normal Blood cell counts and electrolytes are normal Diabetes screening/A1c is normal  Prostate cancer screening is negative, in normal range. Hepatitis C screening is negative. Cholesterol panel is above goal.  His bad cholesterol/LDL is 159.  This puts him at a higher risk of heart attack or stroke than the normal population.  His cardiac risk score is 8.3% likely to have heart attack or stroke within the next 10 years.  This is especially important for him given his family history of heart disease.  -He does meet American Heart Association criteria to start a statin medication to help not only lower his cholesterol but provide him cardiovascular protection.  If he is agreeable to start this medication called Lipitor we will call this in for him.  However, I would recommend he start this after we repeat his liver enzymes from the care been a fine we called him for his toenail fungus.  Both of these medications are metabolized through the liver, therefore we will want a start both at the same time.  Please advise of his decision.

## 2020-11-20 NOTE — Telephone Encounter (Signed)
Spoke with pt regarding labs and instructions. Pt states that he has recently been off his routine but would like to start back with that first.   Pt was inform that it is still recommended to do a low fat diet and cardiovascular exercise. Pt sched for a 3 mos f/u to recheck levels and discuss with provider plan moving forward.

## 2020-11-20 NOTE — Telephone Encounter (Signed)
LVM for pt to CB regarding results.  

## 2020-12-04 ENCOUNTER — Telehealth: Payer: Self-pay | Admitting: Family Medicine

## 2020-12-04 NOTE — Telephone Encounter (Signed)
ROI  for colonoscopy records had been sent to Dr. Dorna Leitz at [facility]. Received reply stating they have no such records.

## 2020-12-04 NOTE — Telephone Encounter (Signed)
Noted  

## 2020-12-16 ENCOUNTER — Other Ambulatory Visit: Payer: Self-pay

## 2020-12-16 ENCOUNTER — Ambulatory Visit (INDEPENDENT_AMBULATORY_CARE_PROVIDER_SITE_OTHER): Payer: 59

## 2020-12-16 ENCOUNTER — Other Ambulatory Visit: Payer: Self-pay | Admitting: Family Medicine

## 2020-12-16 DIAGNOSIS — B351 Tinea unguium: Secondary | ICD-10-CM | POA: Diagnosis not present

## 2020-12-16 DIAGNOSIS — Z79899 Other long term (current) drug therapy: Secondary | ICD-10-CM

## 2020-12-16 LAB — COMPREHENSIVE METABOLIC PANEL
ALT: 21 U/L (ref 0–53)
AST: 24 U/L (ref 0–37)
Albumin: 4.2 g/dL (ref 3.5–5.2)
Alkaline Phosphatase: 52 U/L (ref 39–117)
BUN: 18 mg/dL (ref 6–23)
CO2: 24 mEq/L (ref 19–32)
Calcium: 9 mg/dL (ref 8.4–10.5)
Chloride: 104 mEq/L (ref 96–112)
Creatinine, Ser: 1.03 mg/dL (ref 0.40–1.50)
GFR: 81.23 mL/min (ref >60.00)
Glucose, Bld: 81 mg/dL (ref 70–99)
Potassium: 3.9 mEq/L (ref 3.5–5.1)
Sodium: 138 mEq/L (ref 135–145)
Total Bilirubin: 0.6 mg/dL (ref 0.2–1.2)
Total Protein: 7.2 g/dL (ref 6.0–8.3)

## 2020-12-16 MED ORDER — TERBINAFINE HCL 250 MG PO TABS: 250.0000 mg | ORAL_TABLET | Freq: Every day | ORAL | 0 refills | Status: DC

## 2020-12-16 NOTE — Progress Notes (Signed)
Please inform patient his liver enzymes are normal.  Continue terbinafine for total of 3 months.  Enough medication to last him has been called in to his pharmacy.

## 2021-03-11 ENCOUNTER — Ambulatory Visit (INDEPENDENT_AMBULATORY_CARE_PROVIDER_SITE_OTHER): Payer: 59 | Admitting: Family Medicine

## 2021-03-11 ENCOUNTER — Other Ambulatory Visit: Payer: Self-pay

## 2021-03-11 ENCOUNTER — Encounter: Payer: Self-pay | Admitting: Family Medicine

## 2021-03-11 ENCOUNTER — Telehealth: Payer: Self-pay | Admitting: Family Medicine

## 2021-03-11 VITALS — BP 118/74 | HR 69 | Temp 98.7°F | Ht 72.0 in | Wt 185.0 lb

## 2021-03-11 DIAGNOSIS — E782 Mixed hyperlipidemia: Secondary | ICD-10-CM

## 2021-03-11 DIAGNOSIS — Z8042 Family history of malignant neoplasm of prostate: Secondary | ICD-10-CM | POA: Insufficient documentation

## 2021-03-11 DIAGNOSIS — Z8249 Family history of ischemic heart disease and other diseases of the circulatory system: Secondary | ICD-10-CM | POA: Diagnosis not present

## 2021-03-11 DIAGNOSIS — Z23 Encounter for immunization: Secondary | ICD-10-CM

## 2021-03-11 DIAGNOSIS — B351 Tinea unguium: Secondary | ICD-10-CM | POA: Diagnosis not present

## 2021-03-11 LAB — COMPREHENSIVE METABOLIC PANEL
ALT: 25 U/L (ref 0–53)
AST: 24 U/L (ref 0–37)
Albumin: 4.4 g/dL (ref 3.5–5.2)
Alkaline Phosphatase: 52 U/L (ref 39–117)
BUN: 18 mg/dL (ref 6–23)
CO2: 26 mEq/L (ref 19–32)
Calcium: 9 mg/dL (ref 8.4–10.5)
Chloride: 106 mEq/L (ref 96–112)
Creatinine, Ser: 0.93 mg/dL (ref 0.40–1.50)
GFR: 91.68 mL/min (ref >60.00)
Glucose, Bld: 93 mg/dL (ref 70–99)
Potassium: 3.9 mEq/L (ref 3.5–5.1)
Sodium: 140 mEq/L (ref 135–145)
Total Bilirubin: 0.6 mg/dL (ref 0.2–1.2)
Total Protein: 7.2 g/dL (ref 6.0–8.3)

## 2021-03-11 LAB — LIPID PANEL
Cholesterol: 242 mg/dL — ABNORMAL HIGH (ref 0–200)
HDL: 56.1 mg/dL (ref >39.00)
LDL Cholesterol: 163 mg/dL — ABNORMAL HIGH (ref 0–99)
NonHDL: 185.88
Total CHOL/HDL Ratio: 4
Triglycerides: 113 mg/dL (ref 0.0–149.0)
VLDL: 22.6 mg/dL (ref 0.0–40.0)

## 2021-03-11 MED ORDER — ATORVASTATIN CALCIUM 20 MG PO TABS: 20.0000 mg | ORAL_TABLET | Freq: Every day | ORAL | 3 refills | Status: AC

## 2021-03-11 MED ORDER — ATORVASTATIN CALCIUM 20 MG PO TABS: 20.0000 mg | ORAL_TABLET | Freq: Every day | ORAL | 3 refills | Status: DC

## 2021-03-11 NOTE — Telephone Encounter (Signed)
Please call patient: His liver and kidney function is normal. His cholesterol panel is actually a little higher than prior.  His total cholesterol is 242 and his LDL/bad cholesterol is 163.  With his family history of heart disease in his mother and his bad cholesterol level hanging around the 160s, he would benefit from cardiovascular protection and help lower his cholesterol by starting a medication called Lipitor.  I have called this in for him.  He is to take this before bed nightly.  Follow-up in 3 months fasting and we will recheck his cholesterol at that time to ensure we have met goals.

## 2021-03-11 NOTE — Patient Instructions (Signed)
Great to see you today.  I have refilled the medication(s) we provide.   If labs were collected, we will inform you of lab results once received either by echart message or telephone call.   - echart message- for normal results that have been seen by the patient already.   - telephone call: abnormal results or if patient has not viewed results in their echart.   Preventing High Cholesterol Cholesterol is a white, waxy substance similar to fat that the human body needs to help build cells. The liver makes all the cholesterol that a person's body needs. Having high cholesterol (hypercholesterolemia) increases your risk for heart disease and stroke. Extra or excess cholesterol comes from the food that you eat. High cholesterol can often be prevented with diet and lifestyle changes. If you already have high cholesterol, you can control it with diet, lifestyle changes, and medicines. How can high cholesterol affect me? If you have high cholesterol, fatty deposits (plaques) may build up on the walls of your blood vessels. The blood vessels that carry blood away from your heart are called arteries. Plaques make the arteries narrower and stiffer. This in turn can: Restrict or block blood flow and cause blood clots to form. Increase your risk for heart attack and stroke. What can increase my risk for high cholesterol? This condition is more likely to develop in people who: Eat foods that are high in saturated fat or cholesterol. Saturated fat is mostly found in foods that come from animal sources. Are overweight. Are not getting enough exercise. Use products that contain nicotine or tobacco, such as cigarettes, e-cigarettes, and chewing tobacco. Have a family history of high cholesterol (familial hypercholesterolemia). What actions can I take to prevent this? Nutrition  Eat less saturated fat. Avoid trans fats (partially hydrogenated oils). These are often found in margarine and in some baked  goods, fried foods, and snacks bought in packages. Avoid precooked or cured meat, such as bacon, sausages, or meat loaves. Avoid foods and drinks that have added sugars. Eat more fruits, vegetables, and whole grains. Choose healthy sources of protein, such as fish, poultry, lean cuts of red meat, beans, peas, lentils, and nuts. Choose healthy sources of fat, such as: Nuts. Vegetable oils, especially olive oil. Fish that have healthy fats, such as omega-3 fatty acids. These fish include mackerel or salmon. Lifestyle Lose weight if you are overweight. Maintaining a healthy body mass index (BMI) can help prevent or control high cholesterol. It can also lower your risk for diabetes and high blood pressure. Ask your health care provider to help you with a diet and exercise plan to lose weight safely. Do not use any products that contain nicotine or tobacco. These products include cigarettes, chewing tobacco, and vaping devices, such as e-cigarettes. If you need help quitting, ask your health care provider. Alcohol use Do not drink alcohol if: Your health care provider tells you not to drink. You are pregnant, may be pregnant, or are planning to become pregnant. If you drink alcohol: Limit how much you have to: 0-1 drink a day for women. 0-2 drinks a day for men. Know how much alcohol is in your drink. In the [place] one drink equals one 12 oz bottle of beer (355 mL), one 5 oz glass of wine (148 mL), or one 1 oz glass of hard liquor (44 mL). Activity  Get enough exercise. Do exercises as told by your health care provider. Each week, do at least 150 minutes of exercise that   takes a medium level of effort (moderate-intensity exercise). This kind of exercise: Makes your heart beat faster while allowing you to still be able to talk. Can be done in short sessions several times a day or longer sessions a few times a week. For example, on 5 days each week, you could walk fast or ride your bike 3 times a  day for 10 minutes each time. Medicines Your health care provider may recommend medicines to help lower cholesterol. This may be a medicine to lower the amount of cholesterol that your liver makes. You may need medicine if: Diet and lifestyle changes have not lowered your cholesterol enough. You have high cholesterol and other risk factors for heart disease or stroke. Take over-the-counter and prescription medicines only as told by your health care provider. General information Manage your risk factors for high cholesterol. Talk with your health care provider about all your risk factors and how to lower your risk. Manage other conditions that you have, such as diabetes or high blood pressure (hypertension). Have blood tests to check your cholesterol levels at regular points in time as told by your health care provider. Keep all follow-up visits. This is important. Where to find more information American Heart Association: www.heart.org National Heart, Lung, and Blood Institute: www.nhlbi.nih.gov Summary High cholesterol increases your risk for heart disease and stroke. By keeping your cholesterol level low, you can reduce your risk for these conditions. High cholesterol can often be prevented with diet and lifestyle changes. Work with your health care provider to manage your risk factors, and have your blood tested regularly. This information is not intended to replace advice given to you by your health care provider. Make sure you discuss any questions you have with your health care provider. Document Revised: 06/17/2020 Document Reviewed: 06/17/2020 Elsevier Patient Education  2022 Elsevier Inc.  

## 2021-03-11 NOTE — Progress Notes (Signed)
This visit occurred during the SARS-CoV-2 public health emergency.  Safety protocols were in place, including screening questions prior to the visit, additional usage of staff PPE, and extensive cleaning of exam room while observing appropriate contact time as indicated for disinfecting solutions.    Patient ID: Lucas Doyle, male  DOB: [birth year], 55 y.o.   MRN: [id number] Patient Care Team    Relationship Specialty Notifications Start End  Ma Hillock, DO PCP - General Family Medicine  10/24/19   Margaretmary Bayley, MD Referring Physician Internal Medicine  10/25/19   Pa, [facility]    11/18/20     Chief Complaint  Patient presents with   Hyperlipidemia    F/u; pt is fasting    Subjective: Lucas Doyle is a 55 y.o. male present for cmc All past medical history, surgical history, allergies, family history, immunizations, medications and social history were updated in the electronic medical record today. All recent labs, ED visits and hospitalizations within the last year were reviewed.  HLD/fhx heart disease: Pt reports he has started to exercise more routinely. He cut back on alcohol as well. He started to use airfryer instead of deep frying.   Toenail fungus: He has finished 3 mos of terbinafine and appreciates new growth. He is routinely spraying shoes and feet w/ Lamisil spray.   Depression screen [facility] 2/9 03/11/2021 11/18/2020 10/24/2019  Decreased Interest 0 0 0  Down, Depressed, Hopeless 0 0 0  PHQ - 2 Score 0 0 0   No flowsheet data found.     No flowsheet data found.  Immunization History  Administered Date(s) Administered   Influenza,inj,Quad PF,6+ Mos 03/11/2021   Influenza-Unspecified 02/23/2014, 02/23/2014, 02/08/2015, 02/08/2015, 02/16/2016, 02/16/2016   PFIZER(Purple Top)SARS-COV-2 Vaccination 07/06/2019, 07/31/2019, 04/02/2020   Tdap 07/09/2010, 07/09/2010, 11/18/2020   Zoster Recombinat (Shingrix) 10/24/2019, 12/25/2019   Past Medical  History:  Diagnosis Date   Allergy    BPH (benign prostatic hyperplasia)    Chicken pox    Colon polyps    Hyperlipidemia    Male erectile disorder 06/06/2014   Other allergic rhinitis 07/09/2015   No Known Allergies Past Surgical History:  Procedure Laterality Date   KNEE ARTHROSCOPY W/ ACL RECONSTRUCTION     Family History  Problem Relation Age of Onset   Early death Mother    Diabetes Mother    Heart disease Mother    Heart attack Mother    Stroke Mother    Alcohol abuse Father    Prostate cancer Father    Hypertension Sister    Seizures Son    Throat cancer Maternal Grandfather    Prostate cancer Paternal Grandfather    Social History   Social History Narrative   Marital status/children/pets: Divorced.    Education/employment: 4 yrs college. Self employed.    Safety:      -smoke alarm in the home:Yes     - wears seatbelt: Yes     - Feels safe in their relationships: Yes    Allergies as of 03/11/2021   No Known Allergies      Medication List        Accurate as of March 11, 2021  9:28 AM. If you have any questions, ask your nurse or doctor.          STOP taking these medications    terbinafine 250 MG tablet Commonly known as: LAMISIL Stopped by: Howard Pouch, DO       TAKE these medications    OVER THE  COUNTER MEDICATION Symplex M  156m Once daily       All past medical history, surgical history, allergies, family history, immunizations andmedications were updated in the EMR today and reviewed under the history and medication portions of their EMR.     Recent Results (from the past 2160 hour(s))  Comp Met (CMET)     Status: None   Collection Time: 12/16/20  8:59 AM  Result Value Ref Range   Sodium 138 135 - 145 mEq/L   Potassium 3.9 3.5 - 5.1 mEq/L   Chloride 104 96 - 112 mEq/L   CO2 24 19 - 32 mEq/L   Glucose, Bld 81 70 - 99 mg/dL   BUN 18 6 - 23 mg/dL   Creatinine, Ser 1.03 0.40 - 1.50 mg/dL   Total Bilirubin 0.6 0.2 - 1.2  mg/dL   Alkaline Phosphatase 52 39 - 117 U/L   AST 24 0 - 37 U/L   ALT 21 0 - 53 U/L   Total Protein 7.2 6.0 - 8.3 g/dL   Albumin 4.2 3.5 - 5.2 g/dL   GFR 81.23 >60.00 mL/min    Comment: Calculated using the CKD-EPI Creatinine Equation (2021)   Calcium 9.0 8.4 - 10.5 mg/dL    No results found.   ROS: 14 pt review of systems performed and negative (unless mentioned in an HPI)  Objective: BP 118/74   Pulse 69   Temp 98.7 F (37.1 C) (Oral)   Ht 6' (1.829 m)   Wt 185 lb (83.9 kg)   SpO2 98%   BMI 25.09 kg/m  Gen: Afebrile. No acute distress. Very pleasant male.  HENT: AT. [place].  Eyes:Pupils Equal Round Reactive to light, Extraocular movements intact,  Conjunctiva without redness, discharge or icterus. CV: RRR no murmur, no edema Chest: CTAB, no wheeze or crackles Neuro:  Normal gait. PERLA. EOMi. Alert. Oriented. x3 Psych: Normal affect, dress and demeanor. Normal speech. Normal thought content and judgment..   No results found.  Assessment/plan: GMiklos Doyle a 55y.o. male present for  Mixed hyperlipidemia/fhx heart disease Continue routine exercise.  CMP and lipids collected today. We discussed dietary modification in detail today.  With his fhx HD in mother, low threshold to start Lipitor qhs. Pt agrees.    Onychomycosis of toenail Resolving. New growth appreciated after 12w of terb Continue lamisil spray  Influenza vaccine provided today.   Return if symptoms worsen or fail to improve.   Orders Placed This Encounter  Procedures   Flu Vaccine QUAD 6+ mos PF IM (Fluarix Quad PF)   Comprehensive metabolic panel   Lipid panel   No orders of the defined types were placed in this encounter.   Referral Orders  No referral(s) requested today     Note is dictated utilizing voice recognition software. Although note has been proof read prior to signing, occasional typographical errors still can be missed. If any questions arise, please do not hesitate to  call for verification.  Electronically signed by: RHoward Pouch DO [place]

## 2021-03-12 NOTE — Telephone Encounter (Signed)
Spoke with pt regarding labs and instructions.   

## 2021-06-12 ENCOUNTER — Ambulatory Visit: Payer: 59 | Admitting: Family Medicine

## 2021-07-07 ENCOUNTER — Other Ambulatory Visit: Payer: Self-pay

## 2021-07-07 ENCOUNTER — Ambulatory Visit (INDEPENDENT_AMBULATORY_CARE_PROVIDER_SITE_OTHER): Payer: 59 | Admitting: Family Medicine

## 2021-07-07 ENCOUNTER — Encounter: Payer: Self-pay | Admitting: Family Medicine

## 2021-07-07 VITALS — BP 112/67 | HR 81 | Temp 98.2°F | Ht 72.0 in | Wt 174.0 lb

## 2021-07-07 DIAGNOSIS — Z79899 Other long term (current) drug therapy: Secondary | ICD-10-CM

## 2021-07-07 DIAGNOSIS — F419 Anxiety disorder, unspecified: Secondary | ICD-10-CM

## 2021-07-07 DIAGNOSIS — E782 Mixed hyperlipidemia: Secondary | ICD-10-CM

## 2021-07-07 DIAGNOSIS — Z8249 Family history of ischemic heart disease and other diseases of the circulatory system: Secondary | ICD-10-CM

## 2021-07-07 LAB — LIPID PANEL
Cholesterol: 158 mg/dL (ref 0–200)
HDL: 61.9 mg/dL (ref >39.00)
NonHDL: 95.82
Total CHOL/HDL Ratio: 3
Triglycerides: 201 mg/dL — ABNORMAL HIGH (ref 0.0–149.0)
VLDL: 40.2 mg/dL — ABNORMAL HIGH (ref 0.0–40.0)

## 2021-07-07 LAB — COMPREHENSIVE METABOLIC PANEL
ALT: 24 U/L (ref 0–53)
AST: 21 U/L (ref 0–37)
Albumin: 4.3 g/dL (ref 3.5–5.2)
Alkaline Phosphatase: 51 U/L (ref 39–117)
BUN: 15 mg/dL (ref 6–23)
CO2: 25 mEq/L (ref 19–32)
Calcium: 9.1 mg/dL (ref 8.4–10.5)
Chloride: 106 mEq/L (ref 96–112)
Creatinine, Ser: 0.83 mg/dL (ref 0.40–1.50)
GFR: 97.49 mL/min (ref >60.00)
Glucose, Bld: 79 mg/dL (ref 70–99)
Potassium: 3.8 mEq/L (ref 3.5–5.1)
Sodium: 142 mEq/L (ref 135–145)
Total Bilirubin: 0.6 mg/dL (ref 0.2–1.2)
Total Protein: 6.9 g/dL (ref 6.0–8.3)

## 2021-07-07 LAB — LDL CHOLESTEROL, DIRECT: Direct LDL: 69 mg/dL

## 2021-07-07 MED ORDER — ESCITALOPRAM OXALATE 10 MG PO TABS: 10.0000 mg | ORAL_TABLET | Freq: Every day | ORAL | 1 refills | Status: AC

## 2021-07-07 NOTE — Progress Notes (Signed)
? ? ? ?This visit occurred during the SARS-CoV-2 public health emergency.  Safety protocols were in place, including screening questions prior to the visit, additional usage of staff PPE, and extensive cleaning of exam room while observing appropriate contact time as indicated for disinfecting solutions.  ? ? ?Lucas Doyle , 07/01/1965, 55 y.o., male ?MRN: [id number] ?Patient Care Team  ?  Relationship Specialty Notifications Start End  ?Ma Hillock, DO PCP - General Family Medicine  10/24/19   ?Margaretmary Bayley, MD Referring Physician Internal Medicine  10/25/19   ?[place], [facility]    11/18/20   ? ? ?Chief Complaint  ?Patient presents with  ?? Hyperlipidemia  ?  Pt is fasting  ? ?  ?Subjective: Pt presents for an OV follow up on HLD after medication start 3 months ago.  ?  ?HLD/fhx heart disease: ?Patient reports compliance with started Lipitor 20 mg daily at bedtime.  Toward 20 mg was started approximately 3 months ago after routine cholesterol screening in which she was found to have an LDL of 163.  He is tolerating medicine. He admits he only remembers to take it about 50% of the time.  ?Family history of history of heart disease-mother. ?Prior note: ?Pt reports he has started to exercise more routinely, weight 185 lbs. He cut back on alcohol as well. 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 ?Anxiety: ?Patient reports new onset anxiety today.  He states he used to have nervous energy and be able to exercise it off.  More recently exercise has not been enough.  He has nervous energy, difficulty falling asleep at times, notices when he is worrying more and feeling like his stomach is in knots.  He has started to avoid being around people and staying home more frequently. ? ?Lipid Panel  ?   ?Component Value Date/Time  ? CHOL 242 (H) 03/11/2021 0900  ? TRIG 113.0 03/11/2021 0900  ? HDL 56.10 03/11/2021 0900  ? CHOLHDL 4 03/11/2021 0900  ? VLDL 22.6 03/11/2021 0900  ? LDLCALC 163 (H)  03/11/2021 0900  ? ? ?Depression screen [facility] 2/9 03/11/2021 11/18/2020 10/24/2019  ?Decreased Interest 0 0 0  ?Down, Depressed, Hopeless 0 0 0  ?PHQ - 2 Score 0 0 0  ? ? ?No Known Allergies ?Social History  ? ?Social History Narrative  ? Marital status/children/pets: Divorced.   ? Education/employment: 4 yrs college. Self employed.   ? Safety:   ?   -smoke alarm in the home:Yes  ?   - wears seatbelt: Yes  ?   - Feels safe in their relationships: Yes  ? ?Past Medical History:  ?Diagnosis Date  ?? Allergy   ?? BPH (benign prostatic hyperplasia)   ?? Chicken pox   ?? Colon polyps   ?? Hyperlipidemia   ?? Male erectile disorder 06/06/2014  ?? Other allergic rhinitis 07/09/2015  ? ?Past Surgical History:  ?Procedure Laterality Date  ?? KNEE ARTHROSCOPY W/ ACL RECONSTRUCTION    ? ?Family History  ?Problem Relation Age of Onset  ?? Early death Mother   ?? Diabetes Mother   ?? Heart disease Mother   ?? Heart attack Mother   ?? Stroke Mother   ?? Alcohol abuse Father   ?? Prostate cancer Father   ?? Hypertension Sister   ?? Seizures Son   ?? Throat cancer Maternal Grandfather   ?? Prostate cancer Paternal Grandfather   ? ?Allergies as of 07/07/2021   ?No Known Allergies ?  ? ?  ?  Medication List  ?  ? ?  ? Accurate as of July 07, 2021 10:04 AM. If you have any questions, ask your nurse or doctor.  ?  ?  ? ?  ? ?atorvastatin 20 MG tablet ?Commonly known as: LIPITOR ?Take 1 tablet (20 mg total) by mouth at bedtime. ?  ?escitalopram 10 MG tablet ?Commonly known as: Lexapro ?Take 1 tablet (10 mg total) by mouth daily. ?Started by: Howard Pouch, DO ?  ?[place] ?Symplex M  135mg  Once daily ?  ? ?  ? ? ?All past medical history, surgical history, allergies, family history, immunizations andmedications were updated in the EMR today and reviewed under the history and medication portions of their EMR.    ? ?ROS ?Negative, with the exception of above mentioned in HPI ? ?Objective:  ?BP 112/67   Pulse 81   Temp 98.2 ?F  (36.8 ?C) (Oral)   Ht 6' (1.829 m)   Wt 174 lb (78.9 kg)   SpO2 96%   BMI 23.60 kg/m?  ?Body mass index is 23.6 kg/m?Marland Kitchen ?Physical Exam ?Vitals and nursing note reviewed.  ?Constitutional:   ?   General: He is not in acute distress. ?   Appearance: Normal appearance. He is not ill-appearing, toxic-appearing or diaphoretic.  ?HENT:  ?   Head: Normocephalic and atraumatic.  ?Eyes:  ?   General: No scleral icterus.    ?   Right eye: No discharge.     ?   Left eye: No discharge.  ?   Extraocular Movements: Extraocular movements intact.  ?   Pupils: Pupils are equal, round, and reactive to light.  ?Cardiovascular:  ?   Rate and Rhythm: Normal rate and regular rhythm.  ?   Heart sounds: No murmur heard. ?Musculoskeletal:  ?   Right lower leg: No edema.  ?   Left lower leg: No edema.  ?Skin: ?   General: Skin is warm and dry.  ?   Coloration: Skin is not jaundiced or pale.  ?   Findings: No rash.  ?Neurological:  ?   Mental Status: He is alert and oriented to person, place, and time. Mental status is at baseline.  ?Psychiatric:     ?   Mood and Affect: Mood normal.     ?   Behavior: Behavior normal.     ?   Thought Content: Thought content normal.     ?   Judgment: Judgment normal.  ? ? ? ?No results found. ?No results found. ?No results found for this or any previous visit (from the past 24 hour(s)). ? ?Assessment/Plan: ?Lucas Doyle is a 56 y.o. male present for OV for  ?Mixed hyperlipidemia/On statin therapy/Family history of heart disease ?- He will work on compliance (50% right now). He is setting a phone alarm.  ?- Lipid panel ?- Comprehensive metabolic panel ? ?Anxiety ?New problem.  ?We discussed options today which included medication or therapy. ?He would like to start medication today.  We discussed different options and he is agreeable to start Lexapro 10 mg daily. ?Follow-up in 9-10 weeks. ? ? ?Reviewed expectations re: course of current medical issues. ?Discussed self-management of symptoms. ?Outlined  signs and symptoms indicating need for more acute intervention. ?Patient verbalized understanding and all questions were answered. ?Patient received an After-Visit Summary. ? ? ? ?Orders Placed This Encounter  ?Procedures  ?? Lipid panel  ?? Comprehensive metabolic panel  ? ?Meds ordered this encounter  ?Medications  ?? escitalopram (LEXAPRO)  10 MG tablet  ?  Sig: Take 1 tablet (10 mg total) by mouth daily.  ?  Dispense:  90 tablet  ?  Refill:  1  ? ?Referral Orders  ?No referral(s) requested today  ? ? ? ?Note is dictated utilizing voice recognition software. Although note has been proof read prior to signing, occasional typographical errors still can be missed. If any questions arise, please do not hesitate to call for verification.  ? ?electronically signed by: ? ?Howard Pouch, DO  ?[facility] [place] ? ? ? ?

## 2021-07-07 NOTE — Patient Instructions (Signed)
Preventing High Cholesterol ?Cholesterol is a white, waxy substance similar to fat that the human body needs to help build cells. The liver makes all the cholesterol that a person's body needs. Having high cholesterol (hypercholesterolemia) increases your risk for heart disease and stroke. Extra or excess cholesterol comes from the food that you eat. ?High cholesterol can often be prevented with diet and lifestyle changes. If you already have high cholesterol, you can control it with diet, lifestyle changes, and medicines. ?How can high cholesterol affect me? ?If you have high cholesterol, fatty deposits (plaques) may build up on the walls of your blood vessels. The blood vessels that carry blood away from your heart are called arteries. Plaques make the arteries narrower and stiffer. This in turn can: ?Restrict or block blood flow and cause blood clots to form. ?Increase your risk for heart attack and stroke. ?What can increase my risk for high cholesterol? ?This condition is more likely to develop in people who: ?Eat foods that are high in saturated fat or cholesterol. Saturated fat is mostly found in foods that come from animal sources. ?Are overweight. ?Are not getting enough exercise. ?Use products that contain nicotine or tobacco, such as cigarettes, e-cigarettes, and chewing tobacco. ?Have a family history of high cholesterol (familial hypercholesterolemia). ?What actions can I take to prevent this? ?Nutrition ? ?Eat less saturated fat. ?Avoid trans fats (partially hydrogenated oils). These are often found in margarine and in some baked goods, fried foods, and snacks bought in packages. ?Avoid precooked or cured meat, such as bacon, sausages, or meat loaves. ?Avoid foods and drinks that have added sugars. ?Eat more fruits, vegetables, and whole grains. ?Choose healthy sources of protein, such as fish, poultry, lean cuts of red meat, beans, peas, lentils, and nuts. ?Choose healthy sources of fat, such  as: ?Nuts. ?Vegetable oils, especially olive oil. ?Fish that have healthy fats, such as omega-3 fatty acids. These fish include mackerel or salmon. ?Lifestyle ?Lose weight if you are overweight. Maintaining a healthy body mass index (BMI) can help prevent or control high cholesterol. It can also lower your risk for diabetes and high blood pressure. Ask your health care provider to help you with a diet and exercise plan to lose weight safely. ?Do not use any products that contain nicotine or tobacco. These products include cigarettes, chewing tobacco, and vaping devices, such as e-cigarettes. If you need help quitting, ask your health care provider. ?Alcohol use ?Do not drink alcohol if: ?Your health care provider tells you not to drink. ?You are pregnant, may be pregnant, or are planning to become pregnant. ?If you drink alcohol: ?Limit how much you have to: ?0-1 drink a day for women. ?0-2 drinks a day for men. ?Know how much alcohol is in your drink. In the [place] one drink equals one 12 oz bottle of beer (355 mL), one 5 oz glass of wine (148 mL), or one 1? oz glass of hard liquor (44 mL). ?Activity ? ?Get enough exercise. Do exercises as told by your health care provider. ?Each week, do at least 150 minutes of exercise that takes a medium level of effort (moderate-intensity exercise). This kind of exercise: ?Makes your heart beat faster while allowing you to still be able to talk. ?Can be done in short sessions several times a day or longer sessions a few times a week. For example, on 5 days each week, you could walk fast or ride your bike 3 times a day for 10 minutes each time. ?Medicines ?Your   health care provider may recommend medicines to help lower cholesterol. This may be a medicine to lower the amount of cholesterol that your liver makes. You may need medicine if: ?Diet and lifestyle changes have not lowered your cholesterol enough. ?You have high cholesterol and other risk factors for heart disease or  stroke. ?Take over-the-counter and prescription medicines only as told by your health care provider. ?General information ?Manage your risk factors for high cholesterol. Talk with your health care provider about all your risk factors and how to lower your risk. ?Manage other conditions that you have, such as diabetes or high blood pressure (hypertension). ?Have blood tests to check your cholesterol levels at regular points in time as told by your health care provider. ?Keep all follow-up visits. This is important. ?Where to find more information ?American Heart Association: www.heart.org ?National Heart, Lung, and Blood Institute: www.nhlbi.nih.gov ?Summary ?High cholesterol increases your risk for heart disease and stroke. By keeping your cholesterol level low, you can reduce your risk for these conditions. ?High cholesterol can often be prevented with diet and lifestyle changes. ?Work with your health care provider to manage your risk factors, and have your blood tested regularly. ?This information is not intended to replace advice given to you by your health care provider. Make sure you discuss any questions you have with your health care provider. ?Document Revised: 06/17/2020 Document Reviewed: 06/17/2020 ?Elsevier Patient Education ? 2022 [employer] ? ?

## 2021-09-09 ENCOUNTER — Ambulatory Visit: Payer: 59 | Admitting: Family Medicine

## 2023-10-29 ENCOUNTER — Other Ambulatory Visit: Payer: Self-pay

## 2023-10-29 ENCOUNTER — Encounter (HOSPITAL_BASED_OUTPATIENT_CLINIC_OR_DEPARTMENT_OTHER): Payer: Self-pay | Admitting: Emergency Medicine

## 2023-10-29 ENCOUNTER — Emergency Department (HOSPITAL_BASED_OUTPATIENT_CLINIC_OR_DEPARTMENT_OTHER)

## 2023-10-29 ENCOUNTER — Emergency Department (HOSPITAL_BASED_OUTPATIENT_CLINIC_OR_DEPARTMENT_OTHER)
Admission: EM | Admit: 2023-10-29 | Discharge: 2023-10-29 | Disposition: A | Discharge status: Home / Self Care | Attending: Emergency Medicine | Admitting: Emergency Medicine

## 2023-10-29 DIAGNOSIS — Y92410 Unspecified street and highway as the place of occurrence of the external cause: Secondary | ICD-10-CM | POA: Insufficient documentation

## 2023-10-29 DIAGNOSIS — G44309 Post-traumatic headache, unspecified, not intractable: Secondary | ICD-10-CM | POA: Insufficient documentation

## 2023-10-29 DIAGNOSIS — S86912A Strain of unspecified muscle(s) and tendon(s) at lower leg level, left leg, initial encounter: Secondary | ICD-10-CM

## 2023-10-29 DIAGNOSIS — S8992XA Unspecified injury of left lower leg, initial encounter: Secondary | ICD-10-CM | POA: Diagnosis present

## 2023-10-29 DIAGNOSIS — S86812A Strain of other muscle(s) and tendon(s) at lower leg level, left leg, initial encounter: Secondary | ICD-10-CM | POA: Insufficient documentation

## 2023-10-29 MED ORDER — ACETAMINOPHEN 325 MG PO TABS: 650.0000 mg | ORAL_TABLET | Freq: Four times a day (QID) | ORAL | 0 refills | Status: DC | PRN

## 2023-10-29 MED ORDER — CYCLOBENZAPRINE HCL 5 MG PO TABS
5.0000 mg | ORAL_TABLET | Freq: Once | ORAL | Status: AC
  Administered 2023-10-29: 5 mg via ORAL
  Filled 2023-10-29: qty 1

## 2023-10-29 MED ORDER — CYCLOBENZAPRINE HCL 10 MG PO TABS: 10.0000 mg | ORAL_TABLET | Freq: Two times a day (BID) | ORAL | 0 refills | Status: DC | PRN

## 2023-10-29 MED ORDER — LIDOCAINE 5 % EX PTCH
1.0000 | MEDICATED_PATCH | Freq: Once | CUTANEOUS | Status: DC
  Administered 2023-10-29: 1 via TRANSDERMAL

## 2023-10-29 MED ORDER — OXYCODONE HCL 5 MG PO TABS: 5.0000 mg | ORAL_TABLET | ORAL | 0 refills | Status: DC | PRN

## 2023-10-29 MED ORDER — ACETAMINOPHEN 500 MG PO TABS
1000.0000 mg | ORAL_TABLET | Freq: Once | ORAL | Status: AC
  Administered 2023-10-29: 1000 mg via ORAL
  Filled 2023-10-29: qty 2
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
## 2023-10-29 NOTE — Discharge Instructions (Addendum)
 Based on the events which brought you to the ER today, it is possible that you may have a concussion. A concussion occurs when there is a blow to the head or body, with enough force to shake the brain and disrupt how the brain functions. You may experience symptoms such as headaches, sensitivity to light/noise, dizziness, cognitive slowing, difficulty concentrating / remembering, trouble sleeping and drowsiness. These symptoms may last anywhere from hours/days to potentially weeks/months. While these symptoms are very frustrating and perhaps debilitating, it is important that you remember that they will improve over time. Everyone has a different rate of recovery; it is difficult to predict when your symptoms will resolve. In order to allow for your brain to heal after the injury, we recommend that you see your primary physician or a physician knowledgeable in concussion management. We also advise you to let your body and brain rest: avoid physical activities (sports, gym, and exercise) and reduce cognitive demands (reading, texting, TV watching, computer use, video games, etc). School attendance, after-school activities and work may need to be modified to avoid increasing symptoms. We recommend against driving until all symptoms have resolved. Come back to the ER right away if you are having repeated episodes of vomiting, severe/worsening headache/dizziness or any other symptom that alarms you. We recommended that someone stay with you for the next 24 hours to monitor for these worrisome symptoms.
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
## 2023-11-01 NOTE — Transitions of Care (Post Inpatient/ED Visit) (Signed)
   11/01/2023  Name: Hulet Ehrmann MRN: [id number] DOB: 04/29/1965  Today's TOC FU Call Status:   Patient's Name and Date of Birth confirmed.  Transition Care Management Follow-up Telephone Call Date of Discharge: 11/01/23 Discharge Facility: [facility] Type of Discharge: Emergency Department Reason for ED Visit: Other: How have you been since you were released from the hospital?: Better Any questions or concerns?: No  Items Reviewed: Did you receive and understand the discharge instructions provided?: Yes Medications obtained,verified, and reconciled?: Yes (Medications Reviewed) Any new allergies since your discharge?: No Dietary orders reviewed?: NA Do you have support at home?: No  Medications Reviewed Today: Medications Reviewed Today   Medications were not reviewed in this encounter     Home Care and Equipment/Supplies: Were Home Health Services Ordered?: NA Any new equipment or medical supplies ordered?: NA  Functional Questionnaire: Do you need assistance with bathing/showering or dressing?: No Do you need assistance with meal preparation?: No Do you need assistance with eating?: No Do you have difficulty maintaining continence: No Do you need assistance with getting out of bed/getting out of a chair/moving?: No Do you have difficulty managing or taking your medications?: No  Follow up appointments reviewed: PCP Follow-up appointment confirmed?: Yes Date of PCP follow-up appointment?: 11/08/23 Specialist Hospital Follow-up appointment confirmed?: NA Do you need transportation to your follow-up appointment?: No Do you understand care options if your condition(s) worsen?: Yes-patient verbalized understanding    SIGNATURE Shanda Sharps

## 2023-11-08 ENCOUNTER — Encounter: Payer: Self-pay | Admitting: Family Medicine

## 2023-11-08 ENCOUNTER — Ambulatory Visit: Admitting: Family Medicine

## 2023-11-08 DIAGNOSIS — M25562 Pain in left knee: Secondary | ICD-10-CM

## 2023-11-08 DIAGNOSIS — R42 Dizziness and giddiness: Secondary | ICD-10-CM | POA: Diagnosis not present

## 2023-11-08 DIAGNOSIS — M542 Cervicalgia: Secondary | ICD-10-CM | POA: Diagnosis not present

## 2023-11-08 DIAGNOSIS — R519 Headache, unspecified: Secondary | ICD-10-CM

## 2023-11-08 MED ORDER — CYCLOBENZAPRINE HCL 10 MG PO TABS: 10.0000 mg | ORAL_TABLET | Freq: Two times a day (BID) | ORAL | 0 refills | Status: AC | PRN

## 2023-11-08 NOTE — Patient Instructions (Addendum)
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
